# Patient Record
Sex: Male | Born: 1990 | Race: White | Hispanic: No | Marital: Single | Smoking: Current every day smoker
Health system: Southern US, Community
[De-identification: ages and names within clinical notes are randomized; demographics above are authoritative.]

## PROBLEM LIST (undated history)

## (undated) DIAGNOSIS — F329 Major depressive disorder, single episode, unspecified: Secondary | ICD-10-CM

## (undated) DIAGNOSIS — E669 Obesity, unspecified: Secondary | ICD-10-CM

## (undated) DIAGNOSIS — F32A Depression, unspecified: Secondary | ICD-10-CM

## (undated) DIAGNOSIS — G8929 Other chronic pain: Secondary | ICD-10-CM

## (undated) DIAGNOSIS — R55 Syncope and collapse: Secondary | ICD-10-CM

## (undated) DIAGNOSIS — Z87898 Personal history of other specified conditions: Secondary | ICD-10-CM

## (undated) DIAGNOSIS — M545 Low back pain: Secondary | ICD-10-CM

## (undated) DIAGNOSIS — F319 Bipolar disorder, unspecified: Secondary | ICD-10-CM

## (undated) DIAGNOSIS — E271 Primary adrenocortical insufficiency: Secondary | ICD-10-CM

## (undated) HISTORY — DX: Other chronic pain: G89.29

## (undated) HISTORY — DX: Low back pain: M54.5

## (undated) HISTORY — PX: ADENOIDECTOMY: SUR15

## (undated) HISTORY — PX: KNEE ARTHROSCOPY: SUR90

## (undated) HISTORY — PX: TONSILLECTOMY: SUR1361

## (undated) HISTORY — DX: Major depressive disorder, single episode, unspecified: F32.9

## (undated) HISTORY — PX: TOOTH EXTRACTION: SUR596

## (undated) HISTORY — DX: Syncope and collapse: R55

## (undated) HISTORY — DX: Depression, unspecified: F32.A

## (undated) HISTORY — PX: KNEE SURGERY: SHX244

## (undated) HISTORY — DX: Personal history of other specified conditions: Z87.898

---
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
## 2011-02-18 LAB — URINALYSIS, ROUTINE W REFLEX MICROSCOPIC
Glucose, UA: NEGATIVE
Ketones, ur: NEGATIVE
Nitrite: NEGATIVE
Specific Gravity, Urine: 1.028
pH: 6

## 2011-02-18 LAB — DRUGS OF ABUSE SCREEN W/O ALC, ROUTINE URINE
Amphetamine Screen, Ur: NEGATIVE
Barbiturate Quant, Ur: NEGATIVE
Creatinine,U: 234.7
Marijuana Metabolite: NEGATIVE
Phencyclidine (PCP): NEGATIVE
Propoxyphene: NEGATIVE

## 2011-02-18 LAB — HEPATIC FUNCTION PANEL
Albumin: 4.4
Indirect Bilirubin: 0.6
Total Protein: 6.8

## 2011-02-18 LAB — BASIC METABOLIC PANEL
CO2: 24
Calcium: 9.6
Creatinine, Ser: 0.79
Sodium: 138

## 2011-02-18 LAB — DIFFERENTIAL
Basophils Absolute: 0.1
Basophils Relative: 1
Lymphocytes Relative: 38
Monocytes Relative: 7
Neutro Abs: 4.9
Neutrophils Relative %: 54

## 2011-02-18 LAB — CBC
Hemoglobin: 15.1 — ABNORMAL HIGH
MCHC: 35.7
RBC: 5.09
RDW: 13

## 2011-02-18 LAB — GC/CHLAMYDIA PROBE AMP, URINE
Chlamydia, Swab/Urine, PCR: NEGATIVE
GC Probe Amp, Urine: NEGATIVE

## 2011-02-18 LAB — RPR: RPR Ser Ql: NONREACTIVE

## 2011-02-18 LAB — GAMMA GT: GGT: 22

## 2011-05-22 IMAGING — CT CT ANGIO CHEST
2 of 6 series · 18 of 36 positions shown · IV contrast (APPLIED)
Comparison: Plain film chest of 1 day prior.

CLINICAL DATA: Chest pain and shortness of breath.

CT ANGIOGRAPHY OF THE CHEST
TECHNIQUE: Multidetector CT angiography of the chest was performed
after contrast with bolus timed to evaluate the pulmonary arteries.
Multiplanar CT image reconstructions including MIPs were obtained
to evaluate the vascular anatomy.
Contrast:  80 cc Omnipaque 350

[Series 7: pe 1.0 b25f · axial · 0.69mm/px · z∈[+1326,+1548]mm · 17 of 249 slices shown]
[im 13/249  lung]
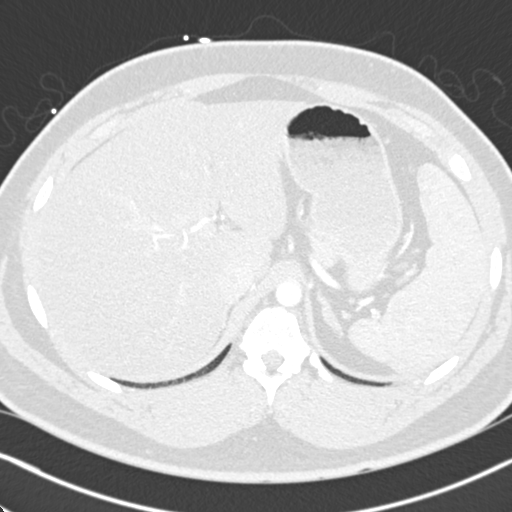
[im 25/249  mediastinal]
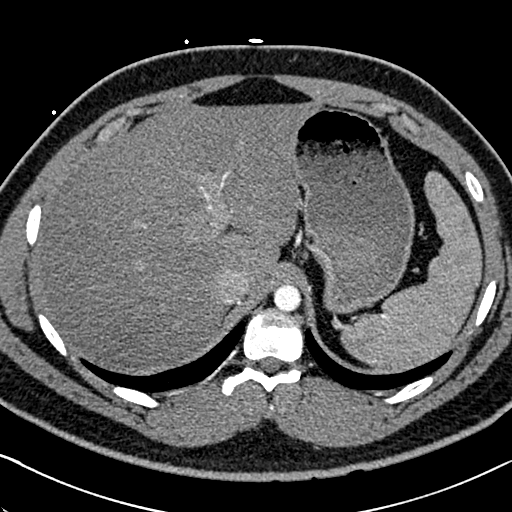
[im 38/249  lung]
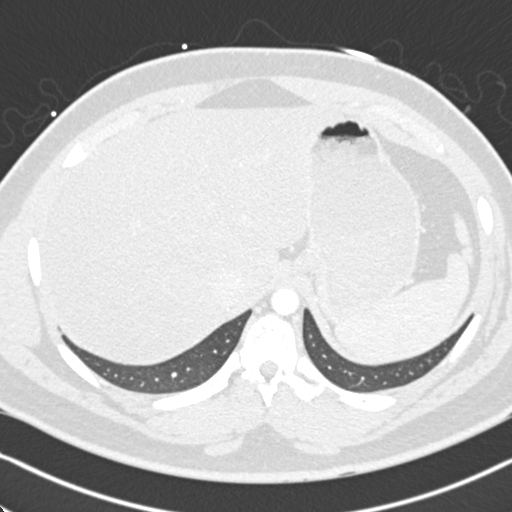
[im 50/249  mediastinal]
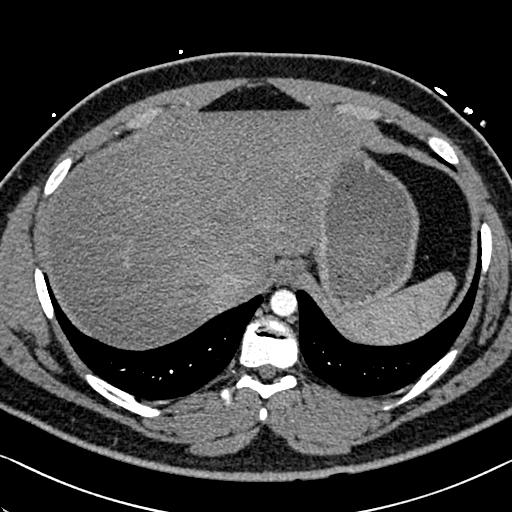
[im 75/249  lung]
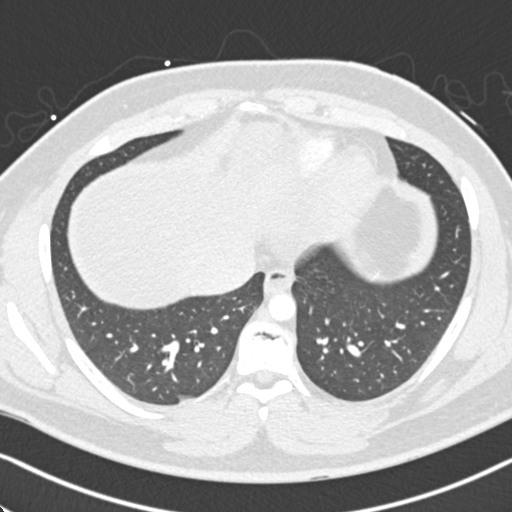
[im 87/249  mediastinal]
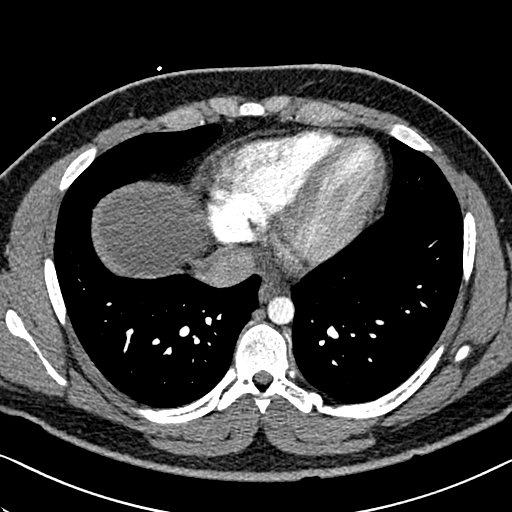
[im 100/249  lung]
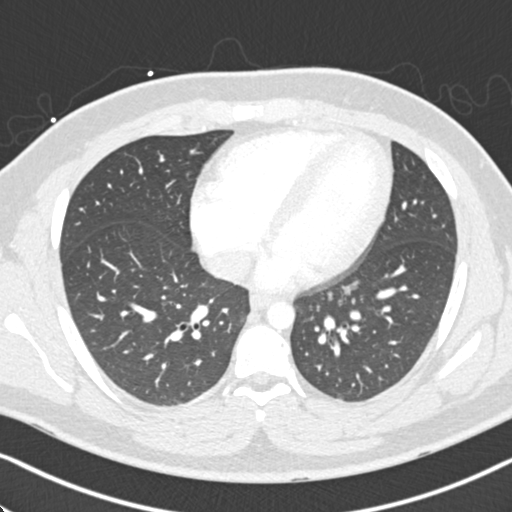
[im 112/249  mediastinal]
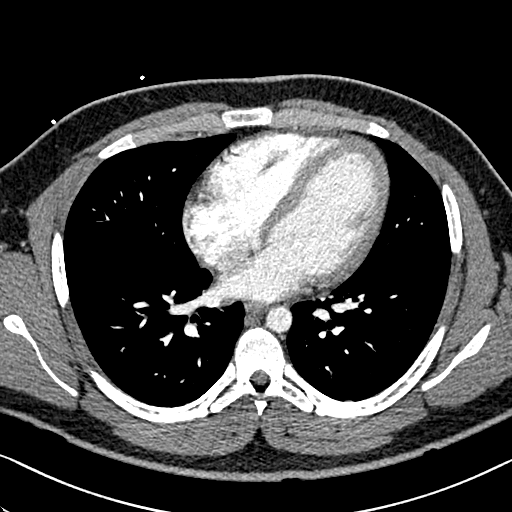
[im 125/249  lung]
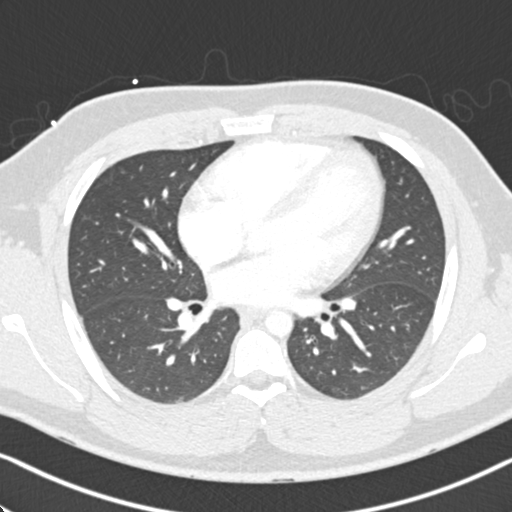
[im 137/249  mediastinal]
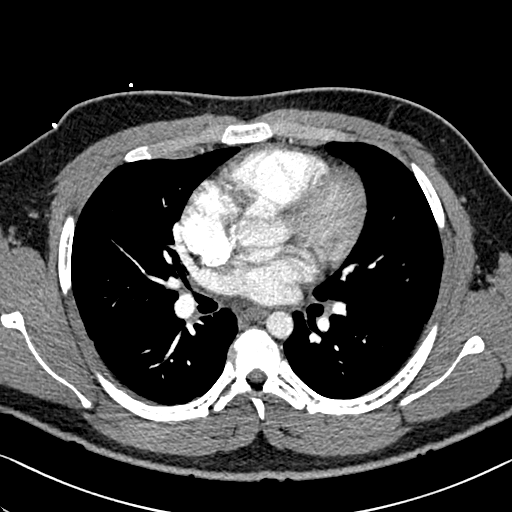
[im 149/249  lung]
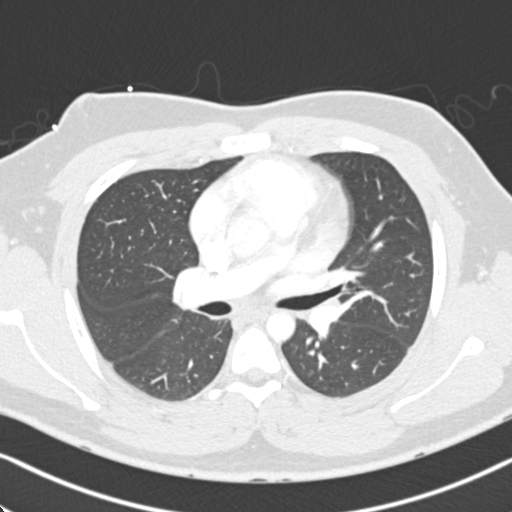
[im 162/249  mediastinal]
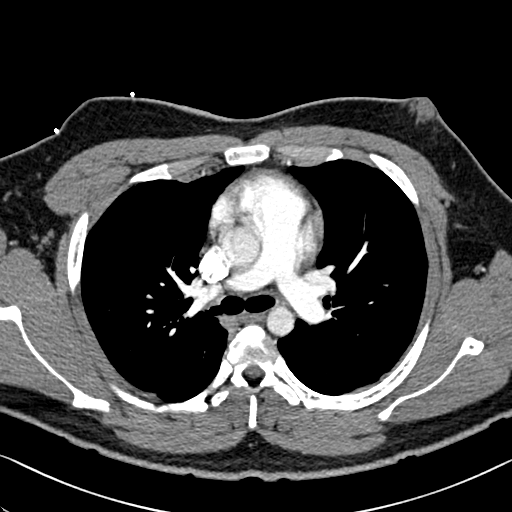
[im 174/249  lung]
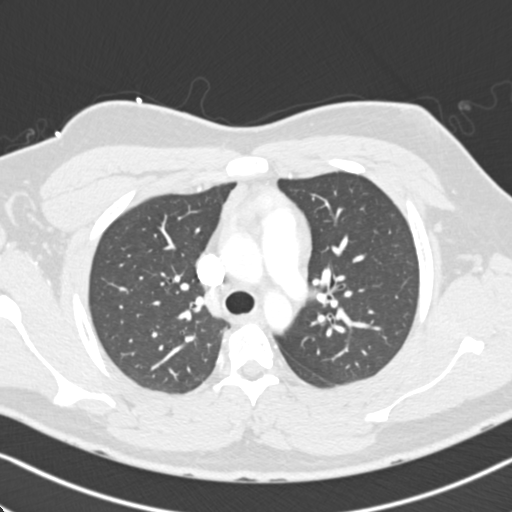
[im 199/249  mediastinal]
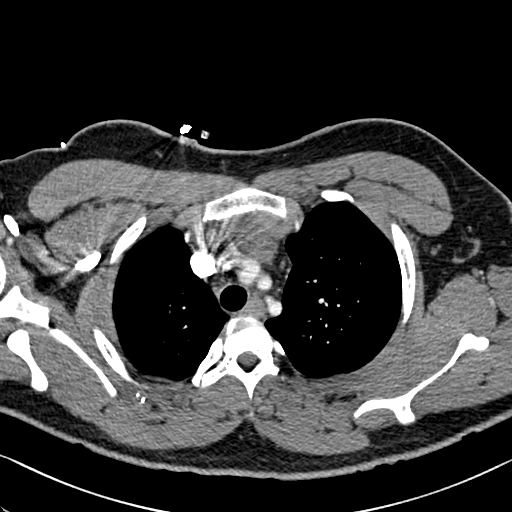
[im 211/249  lung]
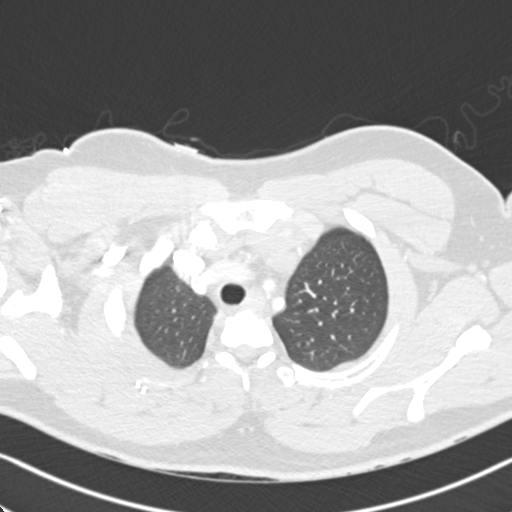
[im 224/249  mediastinal]
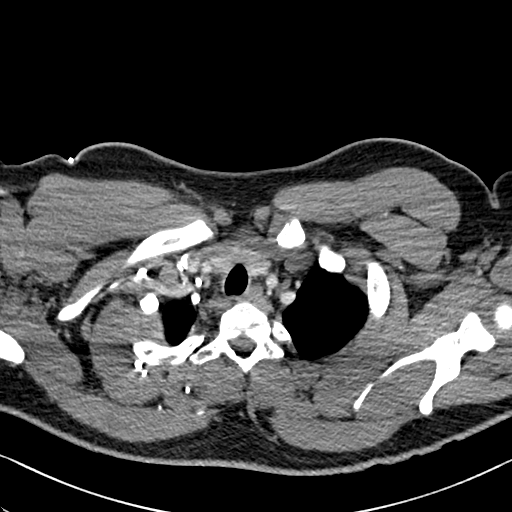
[im 236/249  lung]
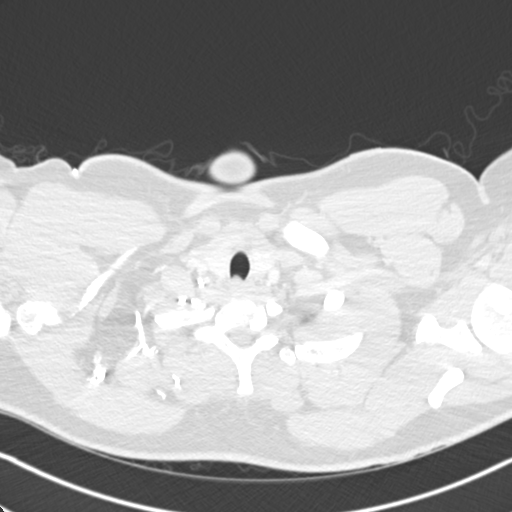

[Series 10: pe 2.0 coronal · coronal · 0.48mm/px · 1 of 133 slices shown]
[im 67/133  mediastinal]
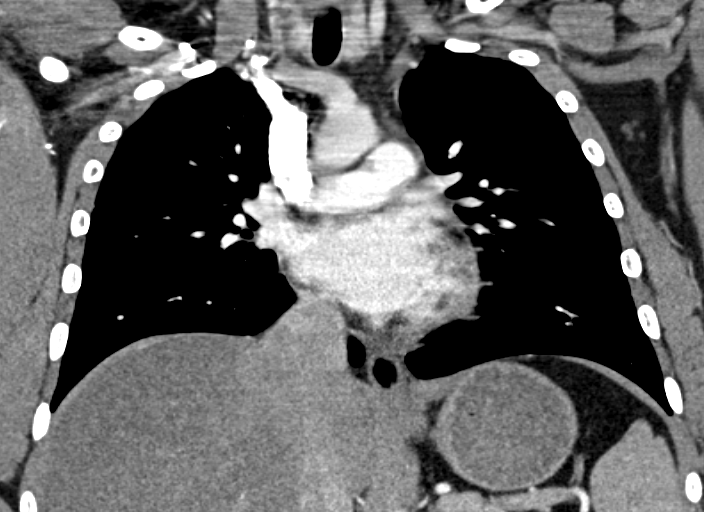

[18 of 36 positions shown; findings below may reference images not displayed]

FINDINGS: Lung windows demonstrate no nodules or airspace
opacities.

Soft tissue windows:  The quality of this exam for evaluation of
pulmonary embolism is good.  There is a large amount of contrast in
the SVC. No evidence of pulmonary embolism.

Left-sided gynecomastia. Normal aortic caliber without dissection.
Normal heart size without pericardial or pleural effusion.  No
mediastinal or hilar adenopathy.  There is soft tissue fullness
within the anterior mediastinum.  This measures maximally 3.6 x
cm.

Limited abdominal imaging demonstrates moderate to marked hepatic
steatosis. No acute osseous abnormality.  Numerous Schmorl's nodes
in the thoracic spine.

 Review of the MIP images confirms the above findings.
IMPRESSION: 1. No evidence of pulmonary embolism.
2.  Soft tissue fullness within the anterior mediastinum.  Although
this could represent residual thymus, it is somewhat greater than
typically identified.  Recommend follow-up with chest CT or (given
radiation concerns) thoracic MRI. These could be performed at 3
months and with contrast.
3.  Hepatic steatosis.
4.  Left-sided gynecomastia.

## 2011-08-29 ENCOUNTER — Emergency Department (INDEPENDENT_AMBULATORY_CARE_PROVIDER_SITE_OTHER): Payer: Federal, State, Local not specified - PPO

## 2011-08-29 ENCOUNTER — Emergency Department (HOSPITAL_BASED_OUTPATIENT_CLINIC_OR_DEPARTMENT_OTHER)
Admission: EM | Admit: 2011-08-29 | Discharge: 2011-08-29 | Disposition: A | Discharge status: Home / Self Care | Payer: Federal, State, Local not specified - PPO | Attending: Emergency Medicine | Admitting: Emergency Medicine

## 2011-08-29 ENCOUNTER — Encounter (HOSPITAL_BASED_OUTPATIENT_CLINIC_OR_DEPARTMENT_OTHER): Payer: Self-pay

## 2011-08-29 DIAGNOSIS — R0602 Shortness of breath: Secondary | ICD-10-CM

## 2011-08-29 DIAGNOSIS — F319 Bipolar disorder, unspecified: Secondary | ICD-10-CM | POA: Insufficient documentation

## 2011-08-29 DIAGNOSIS — J45909 Unspecified asthma, uncomplicated: Secondary | ICD-10-CM

## 2011-08-29 HISTORY — DX: Obesity, unspecified: E66.9

## 2011-08-29 HISTORY — DX: Bipolar disorder, unspecified: F31.9

## 2011-08-29 MED ORDER — PREDNISONE 50 MG PO TABS
60.0000 mg | ORAL_TABLET | Freq: Once | ORAL | Status: AC
  Administered 2011-08-29: 60 mg via ORAL
  Filled 2011-08-29: qty 1

## 2011-08-29 MED ORDER — ALBUTEROL SULFATE (5 MG/ML) 0.5% IN NEBU
5.0000 mg | INHALATION_SOLUTION | Freq: Once | RESPIRATORY_TRACT | Status: AC
  Administered 2011-08-29: 5 mg via RESPIRATORY_TRACT

## 2011-08-29 MED ORDER — ALBUTEROL SULFATE (5 MG/ML) 0.5% IN NEBU
5.0000 mg | INHALATION_SOLUTION | Freq: Once | RESPIRATORY_TRACT | Status: AC
  Administered 2011-08-29: 5 mg via RESPIRATORY_TRACT
  Filled 2011-08-29: qty 1

## 2011-08-29 MED ORDER — ALBUTEROL SULFATE HFA 108 (90 BASE) MCG/ACT IN AERS
2.0000 | INHALATION_SPRAY | RESPIRATORY_TRACT | Status: DC | PRN
  Administered 2011-08-29: 2 via RESPIRATORY_TRACT
  Filled 2011-08-29: qty 6.7

## 2011-08-29 MED ORDER — PREDNISONE 20 MG PO TABS: 40.0000 mg | ORAL_TABLET | Freq: Every day | ORAL | Status: DC

## 2011-08-29 MED ORDER — ALBUTEROL SULFATE (5 MG/ML) 0.5% IN NEBU
INHALATION_SOLUTION | RESPIRATORY_TRACT | Status: AC
  Administered 2011-08-29: 5 mg via RESPIRATORY_TRACT
  Filled 2011-08-29: qty 1

## 2011-08-29 MED ORDER — IPRATROPIUM BROMIDE 0.02 % IN SOLN
0.5000 mg | Freq: Once | RESPIRATORY_TRACT | Status: AC
  Administered 2011-08-29: 0.5 mg via RESPIRATORY_TRACT
  Filled 2011-08-29: qty 2.5

## 2011-08-29 NOTE — Discharge Instructions (Signed)
Asthma Attack Prevention HOW CAN ASTHMA BE PREVENTED? Currently, there is no way to prevent asthma from starting. However, you can take steps to control the disease and prevent its symptoms after you have been diagnosed. Learn about your asthma and how to control it. Take an active role to control your asthma by working with your caregiver to create and follow an asthma action plan. An asthma action plan guides you in taking your medicines properly, avoiding factors that make your asthma worse, tracking your level of asthma control, responding to worsening asthma, and seeking emergency care when needed. To track your asthma, keep records of your symptoms, check your peak flow number using a peak flow meter (handheld device that shows how well air moves out of your lungs), and get regular asthma checkups.  Other ways to prevent asthma attacks include:  Use medicines as your caregiver directs.   Identify and avoid things that make your asthma worse (as much as you can).   Keep track of your asthma symptoms and level of control.   Get regular checkups for your asthma.   With your caregiver, write a detailed plan for taking medicines and managing an asthma attack. Then be sure to follow your action plan. Asthma is an ongoing condition that needs regular monitoring and treatment.   Identify and avoid asthma triggers. A number of outdoor allergens and irritants (pollen, mold, cold air, air pollution) can trigger asthma attacks. Find out what causes or makes your asthma worse, and take steps to avoid those triggers (see below).   Monitor your breathing. Learn to recognize warning signs of an attack, such as slight coughing, wheezing or shortness of breath. However, your lung function may already decrease before you notice any signs or symptoms, so regularly measure and record your peak airflow with a home peak flow meter.   Identify and treat attacks early. If you act quickly, you're less likely to have  a severe attack. You will also need less medicine to control your symptoms. When your peak flow measurements decrease and alert you to an upcoming attack, take your medicine as instructed, and immediately stop any activity that may have triggered the attack. If your symptoms do not improve, get medical help.   Pay attention to increasing quick-relief inhaler use. If you find yourself relying on your quick-relief inhaler (such as albuterol), your asthma is not under control. See your caregiver about adjusting your treatment.  IDENTIFY AND CONTROL FACTORS THAT MAKE YOUR ASTHMA WORSE A number of common things can set off or make your asthma symptoms worse (asthma triggers). Keep track of your asthma symptoms for several weeks, detailing all the environmental and emotional factors that are linked with your asthma. When you have an asthma attack, go back to your asthma diary to see which factor, or combination of factors, might have contributed to it. Once you know what these factors are, you can take steps to control many of them.  Allergies: If you have allergies and asthma, it is important to take asthma prevention steps at home. Asthma attacks (worsening of asthma symptoms) can be triggered by allergies, which can cause temporary increased inflammation of your airways. Minimizing contact with the substance to which you are allergic will help prevent an asthma attack. Animal Dander:   Some people are allergic to the flakes of skin or dried saliva from animals with fur or feathers. Keep these pets out of your home.   If you can't keep a pet outdoors, keep the   pet out of your bedroom and other sleeping areas at all times, and keep the door closed.   Remove carpets and furniture covered with cloth from your home. If that is not possible, keep the pet away from fabric-covered furniture and carpets.  Dust Mites:  Many people with asthma are allergic to dust mites. Dust mites are tiny bugs that are found in  every home, in mattresses, pillows, carpets, fabric-covered furniture, bedcovers, clothes, stuffed toys, fabric, and other fabric-covered items.   Cover your mattress in a special dust-proof cover.   Cover your pillow in a special dust-proof cover, or wash the pillow each week in hot water. Water must be hotter than 130 F to kill dust mites. Cold or warm water used with detergent and bleach can also be effective.   Wash the sheets and blankets on your bed each week in hot water.   Try not to sleep or lie on cloth-covered cushions.   Call ahead when traveling and ask for a smoke-free hotel room. Bring your own bedding and pillows, in case the hotel only supplies feather pillows and down comforters, which may contain dust mites and cause asthma symptoms.   Remove carpets from your bedroom and those laid on concrete, if you can.   Keep stuffed toys out of the bed, or wash the toys weekly in hot water or cooler water with detergent and bleach.  Cockroaches:  Many people with asthma are allergic to the droppings and remains of cockroaches.   Keep food and garbage in closed containers. Never leave food out.   Use poison baits, traps, powders, gels, or paste (for example, boric acid).   If a spray is used to kill cockroaches, stay out of the room until the odor goes away.  Indoor Mold:  Fix leaky faucets, pipes, or other sources of water that have mold around them.   Clean moldy surfaces with a cleaner that has bleach in it.  Pollen and Outdoor Mold:  When pollen or mold spore counts are high, try to keep your windows closed.   Stay indoors with windows closed from late morning to afternoon, if you can. Pollen and some mold spore counts are highest at that time.   Ask your caregiver whether you need to take or increase anti-inflammatory medicine before your allergy season starts.  Irritants:   Tobacco smoke is an irritant. If you smoke, ask your caregiver how you can quit. Ask family  members to quit smoking, too. Do not allow smoking in your home or car.   If possible, do not use a wood-burning stove, kerosene heater, or fireplace. Minimize exposure to all sources of smoke, including incense, candles, fires, and fireworks.   Try to stay away from strong odors and sprays, such as perfume, talcum powder, hair spray, and paints.   Decrease humidity in your home and use an indoor air cleaning device. Reduce indoor humidity to below 60 percent. Dehumidifiers or central air conditioners can do this.   Try to have someone else vacuum for you once or twice a week, if you can. Stay out of rooms while they are being vacuumed and for a short while afterward.   If you vacuum, use a dust mask from a hardware store, a double-layered or microfilter vacuum cleaner bag, or a vacuum cleaner with a HEPA filter.   Sulfites in foods and beverages can be irritants. Do not drink beer or wine, or eat dried fruit, processed potatoes, or shrimp if they cause asthma   symptoms.   Cold air can trigger an asthma attack. Cover your nose and mouth with a scarf on cold or windy days.   Several health conditions can make asthma more difficult to manage, including runny nose, sinus infections, reflux disease, psychological stress, and sleep apnea. Your caregiver will treat these conditions, as well.   Avoid close contact with people who have a cold or the flu, since your asthma symptoms may get worse if you catch the infection from them. Wash your hands thoroughly after touching items that may have been handled by people with a respiratory infection.   Get a flu shot every year to protect against the flu virus, which often makes asthma worse for days or weeks. Also get a pneumonia shot once every five to 10 years.  Drugs:  Aspirin and other painkillers can cause asthma attacks. 10% to 20% of people with asthma have sensitivity to aspirin or a group of painkillers called non-steroidal anti-inflammatory drugs  (NSAIDS), such as ibuprofen and naproxen. These drugs are used to treat pain and reduce fevers. Asthma attacks caused by any of these medicines can be severe and even fatal. These drugs must be avoided in people who have known aspirin sensitive asthma. Products with acetaminophen are considered safe for people who have asthma. It is important that people with aspirin sensitivity read labels of all over-the-counter drugs used to treat pain, colds, coughs, and fever.   Beta blockers and ACE inhibitors are other drugs which you should discuss with your caregiver, in relation to your asthma.  ALLERGY SKIN TESTING  Ask your asthma caregiver about allergy skin testing or blood testing (RAST test) to identify the allergens to which you are sensitive. If you are found to have allergies, allergy shots (immunotherapy) for asthma may help prevent future allergies and asthma. With allergy shots, small doses of allergens (substances to which you are allergic) are injected under your skin on a regular schedule. Over a period of time, your body may become used to the allergen and less responsive with asthma symptoms. You can also take measures to minimize your exposure to those allergens. EXERCISE  If you have exercise-induced asthma, or are planning vigorous exercise, or exercise in cold, humid, or dry environments, prevent exercise-induced asthma by following your caregiver's advice regarding asthma treatment before exercising. Document Released: 04/16/2009 Document Revised: 04/17/2011 Document Reviewed: 04/16/2009 [employer] Patient Information 2012 [employer]. 

## 2011-08-29 NOTE — ED Provider Notes (Signed)
History     CSN: [id number]  Arrival date & time 08/29/11  2000   First MD Initiated Contact with Patient 08/29/11 2034      Chief Complaint  Patient presents with  . Shortness of Breath    (Consider location/radiation/quality/duration/timing/severity/associated sxs/prior treatment) HPI Comments: Pt states that he has been using his neb without relief:pt states that he seems to have a flare up once a year:pt denies fever  Patient is a 21 y.o. male presenting with shortness of breath. The history is provided by the patient. No language interpreter was used.  Shortness of Breath  The current episode started today. The problem occurs continuously. The problem has been unchanged. The problem is mild. The symptoms are relieved by nothing. The symptoms are aggravated by nothing. Associated symptoms include cough and shortness of breath. Pertinent negatives include no fever.    Past Medical History  Diagnosis Date  . Asthma   . Obesity   . Bipolar disorder     Past Surgical History  Procedure Date  . Tonsillectomy   . Adenoidectomy   . Knee surgery   . Tooth extraction     History reviewed. No pertinent family history.  History  Substance Use Topics  . Smoking status: Never Smoker   . Smokeless tobacco: Never Used  . Alcohol Use: No      Review of Systems  Constitutional: Negative for fever.  Eyes: Negative.   Respiratory: Positive for cough and shortness of breath.   Musculoskeletal: Negative.   Neurological: Negative.     Allergies  Geodon and Sulfa antibiotics  Home Medications   Current Outpatient Rx  Name Route Sig Dispense Refill  . ARIPIPRAZOLE 10 MG PO TABS Oral Take 10 mg by mouth daily.    . GUAIFENESIN-DM 100-10 MG/5ML PO SYRP Oral Take 10 mLs by mouth 3 (three) times daily as needed. Patient used this medication for cold symptoms.    Marland Kitchen CLARITIN PO Oral Take 1 tablet by mouth daily as needed. Patient uses this medication for allergies.    .  SERTRALINE HCL 100 MG PO TABS Oral Take 100 mg by mouth daily.      BP 131/66  Pulse 71  Temp(Src) 98.5 F (36.9 C) (Oral)  Resp 26  Ht 5\' 11"  (1.803 m)  Wt 250 lb (113.399 kg)  BMI 34.87 kg/m2  SpO2 97%  Physical Exam  Nursing note and vitals reviewed. Constitutional: He is oriented to person, place, and time. He appears well-developed and well-nourished.  HENT:  Right Ear: External ear normal.  Left Ear: External ear normal.  Nose: Rhinorrhea present.  Mouth/Throat: Posterior oropharyngeal erythema present.  Eyes: EOM are normal.  Neck: Neck supple.  Cardiovascular: Normal rate and regular rhythm.   Pulmonary/Chest: He has wheezes.  Musculoskeletal: Normal range of motion.  Neurological: He is alert and oriented to person, place, and time.  Skin: Skin is warm and dry.  Psychiatric: He has a normal mood and affect.    ED Course  Procedures (including critical care time)  Labs Reviewed - No data to display Dg Chest 2 View  08/29/2011  *RADIOLOGY REPORT*  Clinical Data: Short of breath for 2 days.  History of asthma.  CHEST - 2 VIEW  Comparison: 07/30/2010 and 07/31/2010 CT.  Findings: Midline trachea.  Normal heart size and mediastinal contours. No pleural effusion or pneumothorax.  Mildly low lung volumes. Clear lungs.  IMPRESSION: Low lung volumes without acute disease.  Original Report Authenticated By: Karn Cassis.  TALBOT, M.D.     1. Asthma       MDM  Pt is not longer wheezing and is feeling better at this time:will put on steroids for a couple of day        Teressa Lower, NP 08/29/11 2208

## 2011-08-29 NOTE — Patient Instructions (Signed)
Pt instructed on the use of administering albuteral mdi via aerochamber. Pt toelrated well.

## 2011-08-29 NOTE — ED Notes (Signed)
Pt states that he lives with a smoker, c/o head congestion, wheezing, shortness of breath, cough (prod for clear sputum).  Denies fever, n/v/d/c, pain.

## 2011-08-30 NOTE — ED Provider Notes (Signed)
Medical screening examination/treatment/procedure(s) were performed by non-physician practitioner and as supervising physician I was immediately available for consultation/collaboration.   Carleene Cooper III, MD 08/30/11 1150

## 2012-10-16 DIAGNOSIS — J45909 Unspecified asthma, uncomplicated: Secondary | ICD-10-CM | POA: Insufficient documentation

## 2012-10-16 DIAGNOSIS — F309 Manic episode, unspecified: Secondary | ICD-10-CM | POA: Insufficient documentation

## 2012-12-14 DIAGNOSIS — T6391XA Toxic effect of contact with unspecified venomous animal, accidental (unintentional), initial encounter: Secondary | ICD-10-CM | POA: Insufficient documentation

## 2013-03-08 ENCOUNTER — Encounter (HOSPITAL_BASED_OUTPATIENT_CLINIC_OR_DEPARTMENT_OTHER): Payer: Self-pay | Admitting: Emergency Medicine

## 2013-03-08 ENCOUNTER — Emergency Department (HOSPITAL_BASED_OUTPATIENT_CLINIC_OR_DEPARTMENT_OTHER)
Admission: EM | Admit: 2013-03-08 | Discharge: 2013-03-08 | Disposition: A | Discharge status: Home / Self Care | Payer: Federal, State, Local not specified - PPO | Attending: Emergency Medicine | Admitting: Emergency Medicine

## 2013-03-08 DIAGNOSIS — Z79899 Other long term (current) drug therapy: Secondary | ICD-10-CM | POA: Insufficient documentation

## 2013-03-08 DIAGNOSIS — J45909 Unspecified asthma, uncomplicated: Secondary | ICD-10-CM | POA: Insufficient documentation

## 2013-03-08 DIAGNOSIS — F319 Bipolar disorder, unspecified: Secondary | ICD-10-CM | POA: Insufficient documentation

## 2013-03-08 DIAGNOSIS — E2749 Other adrenocortical insufficiency: Secondary | ICD-10-CM | POA: Insufficient documentation

## 2013-03-08 DIAGNOSIS — M545 Low back pain, unspecified: Secondary | ICD-10-CM | POA: Insufficient documentation

## 2013-03-08 DIAGNOSIS — M542 Cervicalgia: Secondary | ICD-10-CM | POA: Insufficient documentation

## 2013-03-08 DIAGNOSIS — M549 Dorsalgia, unspecified: Secondary | ICD-10-CM

## 2013-03-08 DIAGNOSIS — IMO0002 Reserved for concepts with insufficient information to code with codable children: Secondary | ICD-10-CM | POA: Insufficient documentation

## 2013-03-08 DIAGNOSIS — E669 Obesity, unspecified: Secondary | ICD-10-CM | POA: Insufficient documentation

## 2013-03-08 HISTORY — DX: Primary adrenocortical insufficiency: E27.1

## 2013-03-08 MED ORDER — DIAZEPAM 5 MG PO TABS
5.0000 mg | ORAL_TABLET | Freq: Once | ORAL | Status: AC
  Administered 2013-03-08: 5 mg via ORAL
  Filled 2013-03-08: qty 1

## 2013-03-08 MED ORDER — IBUPROFEN 800 MG PO TABS
ORAL_TABLET | ORAL | Status: AC
  Filled 2013-03-08: qty 1

## 2013-03-08 MED ORDER — IBUPROFEN 800 MG PO TABS: 800.0000 mg | ORAL_TABLET | Freq: Three times a day (TID) | ORAL | Status: DC

## 2013-03-08 MED ORDER — OXYCODONE-ACETAMINOPHEN 5-325 MG PO TABS: 1.0000 | ORAL_TABLET | Freq: Four times a day (QID) | ORAL | Status: DC | PRN

## 2013-03-08 MED ORDER — IBUPROFEN 800 MG PO TABS
800.0000 mg | ORAL_TABLET | Freq: Once | ORAL | Status: AC
  Administered 2013-03-08: 800 mg via ORAL
  Filled 2013-03-08: qty 1

## 2013-03-08 MED ORDER — OXYCODONE-ACETAMINOPHEN 5-325 MG PO TABS
1.0000 | ORAL_TABLET | Freq: Once | ORAL | Status: AC
  Administered 2013-03-08: 1 via ORAL
  Filled 2013-03-08: qty 1

## 2013-03-08 MED ORDER — CYCLOBENZAPRINE HCL 5 MG PO TABS: 5.0000 mg | ORAL_TABLET | Freq: Three times a day (TID) | ORAL | Status: DC | PRN

## 2013-03-08 NOTE — ED Notes (Signed)
Pt. Reports neck and back pain with neck pain starting today and back pain chronic.

## 2013-03-08 NOTE — ED Notes (Signed)
MD at bedside. 

## 2013-03-08 NOTE — ED Provider Notes (Signed)
CSN: [id number]     Arrival date & time 03/08/13  2003 History   First MD Initiated Contact with Patient 03/08/13 2256     Chief Complaint  Patient presents with  . Back Pain  . Neck Pain   (Consider location/radiation/quality/duration/timing/severity/associated sxs/prior Treatment) HPI Pt presents with c/o low back pain which has been present since august as well as pain in left side of neck which has been going on for the past 2-3 days.  Pain is worse with movement and palpation.  No fever, no injuries or trauma.  No new activities.  Pt has seen chiropractor for symptoms in the past and has had physical therapy but states the pain recurred after stopping the physical therapy.  Pain in low back is bilateral.  No fever/chills.  No weakness in legs, no incontinence of urine, no retention of urine.  No incontinence of stool.  There are no other associated systemic symptoms, there are no other alleviating or modifying factors.   Past Medical History  Diagnosis Date  . Asthma   . Obesity   . Bipolar disorder   . Addison's disease    Past Surgical History  Procedure Laterality Date  . Tonsillectomy    . Adenoidectomy    . Knee surgery    . Tooth extraction     No family history on file. History  Substance Use Topics  . Smoking status: Never Smoker   . Smokeless tobacco: Never Used  . Alcohol Use: No    Review of Systems ROS reviewed and all otherwise negative except for mentioned in HPI  Allergies  Geodon and Sulfa antibiotics  Home Medications   Current Outpatient Rx  Name  Route  Sig  Dispense  Refill  . fludrocortisone (FLORINEF) 0.1 MG tablet   Oral   Take 0.1 mg by mouth daily.         . ARIPiprazole (ABILIFY) 10 MG tablet   Oral   Take 10 mg by mouth daily.         . cyclobenzaprine (FLEXERIL) 5 MG tablet   Oral   Take 1 tablet (5 mg total) by mouth 3 (three) times daily as needed for muscle spasms.   20 tablet   0   . guaiFENesin-dextromethorphan  (ROBITUSSIN DM) 100-10 MG/5ML syrup   Oral   Take 10 mLs by mouth 3 (three) times daily as needed. Patient used this medication for cold symptoms.         Marland Kitchen ibuprofen (ADVIL,MOTRIN) 800 MG tablet   Oral   Take 1 tablet (800 mg total) by mouth 3 (three) times daily.   21 tablet   0   . Loratadine (CLARITIN PO)   Oral   Take 1 tablet by mouth daily as needed. Patient uses this medication for allergies.         Marland Kitchen oxyCODONE-acetaminophen (PERCOCET/ROXICET) 5-325 MG per tablet   Oral   Take 1-2 tablets by mouth every 6 (six) hours as needed for pain.   15 tablet   0   . predniSONE (DELTASONE) 20 MG tablet   Oral   Take 2 tablets (40 mg total) by mouth daily.   10 tablet   0   . sertraline (ZOLOFT) 100 MG tablet   Oral   Take 100 mg by mouth daily.          BP 120/72  Pulse 76  Temp(Src) 98.4 F (36.9 C) (Oral)  Resp 17  Ht 5\' 10"  (1.778 m)  Wt  240 lb (108.863 kg)  BMI 34.44 kg/m2  SpO2 99% Vitals reviewed Physical Exam Physical Examination: General appearance - alert, well appearing, and in no distress Mental status - alert, oriented to person, place, and time Eyes - no scleral icterus, no conjunctival injection Neck - supple, no significant adenopathy, no midlinte tenderness to palpation, some ttp on left cervical parapsinal region Chest - clear to auscultation, no wheezes, rales or rhonchi, symmetric air entry Heart - normal rate, regular rhythm, normal S1, S2, no murmurs, rubs, clicks or gallops Back exam - no midline tenderness to palpation, mild bialteral lumbar paraspinal tenderness, no CVA tenderness Neurological - alert, oriented, normal speech, strength 5/5 in extremities x 4, sensation intact Musculoskeletal - no joint tenderness, deformity or swelling Extremities - peripheral pulses normal, no pedal edema, no clubbing or cyanosis Skin - normal coloration and turgor, no rashes  ED Course  Procedures (including critical care time) Labs Review Labs  Reviewed - No data to display Imaging Review No results found.  EKG Interpretation   None       MDM   1. Back pain   2. Neck pain on left side    Pt presenting with c/o low back pain and left sided neck pain.  Exam c/w muscular pain, no signs or symptoms of cauda equina.  No fever or risk factors for epidural abscess.  Pt started on ibuprofen, muscle relaxers, and pain medications.   Advised PMD f/u and advised that if pain continues he may need MRI on an outpatient basis.  Discharged with strict return precautions.  Pt agreeable with plan.    Ethelda Chick, MD 03/09/13 [phone]

## 2013-04-04 DIAGNOSIS — M5430 Sciatica, unspecified side: Secondary | ICD-10-CM | POA: Insufficient documentation

## 2013-10-08 ENCOUNTER — Emergency Department (HOSPITAL_BASED_OUTPATIENT_CLINIC_OR_DEPARTMENT_OTHER)
Admission: EM | Admit: 2013-10-08 | Discharge: 2013-10-08 | Disposition: A | Discharge status: Home / Self Care | Payer: Federal, State, Local not specified - PPO | Attending: Emergency Medicine | Admitting: Emergency Medicine

## 2013-10-08 ENCOUNTER — Encounter (HOSPITAL_BASED_OUTPATIENT_CLINIC_OR_DEPARTMENT_OTHER): Payer: Self-pay | Admitting: Emergency Medicine

## 2013-10-08 DIAGNOSIS — E2749 Other adrenocortical insufficiency: Secondary | ICD-10-CM | POA: Insufficient documentation

## 2013-10-08 DIAGNOSIS — E669 Obesity, unspecified: Secondary | ICD-10-CM | POA: Insufficient documentation

## 2013-10-08 DIAGNOSIS — IMO0002 Reserved for concepts with insufficient information to code with codable children: Secondary | ICD-10-CM | POA: Insufficient documentation

## 2013-10-08 DIAGNOSIS — J45909 Unspecified asthma, uncomplicated: Secondary | ICD-10-CM | POA: Insufficient documentation

## 2013-10-08 DIAGNOSIS — Z79899 Other long term (current) drug therapy: Secondary | ICD-10-CM | POA: Insufficient documentation

## 2013-10-08 DIAGNOSIS — R197 Diarrhea, unspecified: Secondary | ICD-10-CM | POA: Insufficient documentation

## 2013-10-08 DIAGNOSIS — F319 Bipolar disorder, unspecified: Secondary | ICD-10-CM | POA: Insufficient documentation

## 2013-10-08 DIAGNOSIS — R55 Syncope and collapse: Secondary | ICD-10-CM

## 2013-10-08 DIAGNOSIS — E271 Primary adrenocortical insufficiency: Secondary | ICD-10-CM

## 2013-10-08 LAB — CBC WITH DIFFERENTIAL/PLATELET
BASOS ABS: 0 10*3/uL (ref 0.0–0.1)
BASOS PCT: 1 % (ref 0–1)
EOS ABS: 0.3 10*3/uL (ref 0.0–0.7)
EOS PCT: 5 % (ref 0–5)
HCT: 43.1 % (ref 39.0–52.0)
Hemoglobin: 15.9 g/dL (ref 13.0–17.0)
Lymphocytes Relative: 28 % (ref 12–46)
Lymphs Abs: 1.6 10*3/uL (ref 0.7–4.0)
MCH: 30.7 pg (ref 26.0–34.0)
MCHC: 36.9 g/dL — AB (ref 30.0–36.0)
MCV: 83.2 fL (ref 78.0–100.0)
Monocytes Absolute: 0.7 10*3/uL (ref 0.1–1.0)
Monocytes Relative: 11 % (ref 3–12)
Neutro Abs: 3.3 10*3/uL (ref 1.7–7.7)
Neutrophils Relative %: 56 % (ref 43–77)
PLATELETS: 166 10*3/uL (ref 150–400)
RBC: 5.18 MIL/uL (ref 4.22–5.81)
RDW: 12.1 % (ref 11.5–15.5)
WBC: 5.8 10*3/uL (ref 4.0–10.5)

## 2013-10-08 LAB — URINALYSIS, ROUTINE W REFLEX MICROSCOPIC
BILIRUBIN URINE: NEGATIVE
Glucose, UA: NEGATIVE mg/dL
HGB URINE DIPSTICK: NEGATIVE
Ketones, ur: NEGATIVE mg/dL
Leukocytes, UA: NEGATIVE
NITRITE: NEGATIVE
PROTEIN: NEGATIVE mg/dL
Specific Gravity, Urine: 1.025 (ref 1.005–1.030)
UROBILINOGEN UA: 0.2 mg/dL (ref 0.0–1.0)
pH: 6 (ref 5.0–8.0)

## 2013-10-08 LAB — COMPREHENSIVE METABOLIC PANEL
ALBUMIN: 4.5 g/dL (ref 3.5–5.2)
ALT: 26 U/L (ref 0–53)
AST: 23 U/L (ref 0–37)
Alkaline Phosphatase: 100 U/L (ref 39–117)
BUN: 15 mg/dL (ref 6–23)
CALCIUM: 9.8 mg/dL (ref 8.4–10.5)
CO2: 24 mEq/L (ref 19–32)
Chloride: 103 mEq/L (ref 96–112)
Creatinine, Ser: 1 mg/dL (ref 0.50–1.35)
GFR calc non Af Amer: >90 mL/min (ref >90)
GLUCOSE: 95 mg/dL (ref 70–99)
Potassium: 4.4 mEq/L (ref 3.7–5.3)
SODIUM: 140 meq/L (ref 137–147)
TOTAL PROTEIN: 7.4 g/dL (ref 6.0–8.3)
Total Bilirubin: 0.6 mg/dL (ref 0.3–1.2)

## 2013-10-08 MED ORDER — SODIUM CHLORIDE 0.9 % IV SOLN
Freq: Once | INTRAVENOUS | Status: AC
  Administered 2013-10-08: 1000 mL via INTRAVENOUS

## 2013-10-08 NOTE — ED Notes (Signed)
Patient c/o diarrhea since Tuesday, no vomiting, no fever , generalized abd pain, took imodium yesterday, some diarrhea at [facility]

## 2013-10-08 NOTE — ED Provider Notes (Signed)
CSN: [id number]     Arrival date & time 10/08/13  1148 History   First MD Initiated Contact with Patient 10/08/13 1209     Chief Complaint  Patient presents with  . Loss of Consciousness     (Consider location/radiation/quality/duration/timing/severity/associated sxs/prior Treatment) Patient is a 23 y.o. male presenting with syncope. The history is provided by the patient.  Loss of Consciousness Episode history:  Single Most recent episode:  Today Timing:  Constant Progression:  Resolved Chronicity:  New Witnessed: no   Relieved by:  Nothing Worsened by:  Nothing tried Ineffective treatments:  None tried Associated symptoms: no nausea and no palpitations   Risk factors: no congenital heart disease   Pt has a history of addison's disease.   Pt reports He has has diarrhea for several days prioer to working outside today  Past Medical History  Diagnosis Date  . Asthma   . Obesity   . Bipolar disorder   . Addison's disease    Past Surgical History  Procedure Laterality Date  . Tonsillectomy    . Adenoidectomy    . Knee surgery    . Tooth extraction     History reviewed. No pertinent family history. History  Substance Use Topics  . Smoking status: Never Smoker   . Smokeless tobacco: Never Used  . Alcohol Use: No    Review of Systems  Cardiovascular: Positive for syncope. Negative for palpitations.  Gastrointestinal: Negative for nausea.  All other systems reviewed and are negative.     Allergies  Geodon and Sulfa antibiotics  Home Medications   Prior to Admission medications   Medication Sig Start Date End Date Taking? Authorizing Provider  ARIPiprazole (ABILIFY) 10 MG tablet Take 10 mg by mouth daily.    Historical Provider, MD  fludrocortisone (FLORINEF) 0.1 MG tablet Take 0.1 mg by mouth daily.    Historical Provider, MD  guaiFENesin-dextromethorphan (ROBITUSSIN DM) 100-10 MG/5ML syrup Take 10 mLs by mouth 3 (three) times daily as needed. Patient used  this medication for cold symptoms.    Historical Provider, MD  Loratadine (CLARITIN PO) Take 1 tablet by mouth daily as needed. Patient uses this medication for allergies.    Historical Provider, MD  sertraline (ZOLOFT) 100 MG tablet Take 100 mg by mouth daily.    Historical Provider, MD   BP 121/73  Pulse 94  Temp(Src) 98.5 F (36.9 C) (Oral)  Resp 20  SpO2 99% Physical Exam  Nursing note and vitals reviewed. Constitutional: He is oriented to person, place, and time. He appears well-developed and well-nourished.  HENT:  Head: Normocephalic.  Right Ear: External ear normal.  Left Ear: External ear normal.  Eyes: Conjunctivae and EOM are normal. Pupils are equal, round, and reactive to light.  Neck: Normal range of motion.  Cardiovascular: Normal rate, regular rhythm and normal heart sounds.   Pulmonary/Chest: Effort normal and breath sounds normal.  Abdominal: Soft. He exhibits no distension.  Musculoskeletal: Normal range of motion.  Neurological: He is alert and oriented to person, place, and time.  Skin: Skin is warm.  Psychiatric: He has a normal mood and affect.    ED Course  Procedures (including critical care time) Labs Review Labs Reviewed - No data to display  Imaging Review No results found.   EKG Interpretation   Date/Time:  Saturday Oct 08 2013 11:55:48 EDT Ventricular Rate:  86 PR Interval:  167 QRS Duration: 96 QT Interval:  350 QTC Calculation: 419 R Axis:   79 Text Interpretation:  Sinus rhythm ST elev, probable normal early repol  pattern Baseline wander in lead(s) V3 No significant change since last  tracing Confirmed by [facility]  MD, Alphonzo LemmingsWHITNEY ([id number]) on 10/08/2013 12:04:27  PM      MDM   Final diagnoses:  None        Elson AreasLeslie K Jeana Kersting, PA-C 10/08/13 1403

## 2013-10-08 NOTE — Discharge Instructions (Signed)
Addison's Disease Addison's disease is a glandular (endocrine) or hormonal disorder. It is also called adrenal insufficiency or hypocortisolism. It affects about 1 in 100,000 people. It can affect men and women in all age groups. This disease occurs when the adrenal glands do not make enough of the hormone cortisol. In some cases, the adrenal glands also do not make the hormone aldosterone. Without the right levels of these hormones, your body cannot maintain critical life functions. The adrenal glands are located just above the kidneys. Cortisol is a steroid hormone. Its most important job is to help the body respond to stress. Cortisol also helps the body to:  Maintain blood pressure and heart(cardiovascular) function.  Slow the immune system's response to inflammation.  Control the use of proteins, carbohydrates (sugars), and fats.  Maintain a sense of well-being. CAUSES  A lack of cortisol can happen for different reasons.   Primary adrenal insufficiency is Addison's disease. The adrenal glands do not produce enough, or any, cortisol. Usually, aldosterone is also not produced.  Secondary adrenal insufficiency. The pituitary gland may not make enough of a hormone called ACTH (adrenocorticotropin). This hormone causes the adrenal glands to produce cortisol. Usually, there is enough aldosterone. Primary Adrenal Insufficiency can be caused by:  Autoimmune disease. Your body can produce antibodies that attack its own organs (in this case, your adrenal glands). The reasons why this happens are not well understood at this time. Sometimes, other organs are also affected (the polyglandular autoimmune syndromes).  An infection of the adrenal glands. Possible causes include tuberculosis, viruses (including HIV), and fungal infections. Secondary Adrenal Insufficiency This form is much more common than the primary form. It can be traced to a lack of ACTH. Without ACTH, the adrenal glands cannot make  cortisol. Causes include:  Diseases of the pituitary gland. This is a small gland in the brain that controls many important body functions. The pituitary gland produces ACTH, among other hormones. Pituitary gland tumors, injury, or surgery can cause inadequate ACTH production, which causes inadequate cortisol production.  Medications:  Megestrol (used for cancer treatment and to stimulate appetite) and some pain medications can impair production of cortisol.  Use of cortisol medication (steroids) causes your adrenal glands to not produce cortisol. When you stop this medication, it can take time for the adrenal glands to start producing cortisol again. This can be a dangerous situation, requiring slowly reducing your cortisol medicine. SYMPTOMS  Symptoms of adrenal insufficiency normally begin slowly. Problems seen with the disease are:  Severe tiredness (fatigue).  Muscle weakness.  Loss of appetite.  Weight loss. About 50% of the time, the following symptoms occur:   Nausea, vomiting and diarrhea.  Drops in blood pressure.  Dizziness or fainting.  Darkening of the skin (with primary disease only).  Being easily angered (irritable).  Depression.  Salt craving.  Low blood sugar (hypoglycemia).  Irregular or no menstrual periods. The symptoms slowly get worse. They are often ignored until a stressful event like an illness or an accident occurs. This is called an Addisonian crisis, or acute adrenal insufficiency. Without treatment, an Addisonian crisis can cause death. Symptoms of an Addisonian crisis include:  Sudden, severe pain in the lower back, abdomen, or legs.  Severe vomiting and diarrhea.  Dehydration.  Low blood pressure.  Loss of consciousness. DIAGNOSIS  In its early stages, adrenal insufficiency can be hard to diagnose. Your caregiver will need to:  Review your medical history.  Review your symptoms, such as dark tanning of the  skin.  Perform lab  tests. Results will show if levels of cortisol are too low, and will help identify the cause. CT scan or MRI scan of the adrenal and pituitary glands may also be necessary. TREATMENT  With proper treatment, you can live a normal life with Addison's disease or adrenal insufficiency.  Missing hormones need to be replaced in order to treat Addison's disease. Cortisol is replaced with hydrocortisone tablets taken by mouth. Other forms of cortisol may also be used. Since cortisone levels normally are higher in the morning and lower in the evening, you may need different doses at different times of the day. Be sure to follow your instructions carefully.  If your body cannot maintain the right levels of salt (sodium) and fluids because of too little aldosterone, you will also be given a medication. This drug replaces aldosterone. Important points about treatment:  Any sudden (acute) illness can increase your body's need for cortisol. Surgery, or other stress on the body, can do the same. If you have Addison's disease and you are ill or having surgery, you will need an increase in hormone medication to prevent an Addisonian crisis. Untreated, an Addisonian crisis can cause death.  If you are too ill to take your medication or you cannot keep it down, you must take medicine through a shot (injection). You or someone who lives with you will need to learn how to give you this injection. The shot will take the place of hydrocortisone. If you find it necessary to give yourself injectable medication, call your caregiver right away, or go to the nearest hospital emergency room. HOME CARE INSTRUCTIONS   Always carry an identification card stating your condition in case of an emergency. The card should:  Alert emergency personnel about the need to inject 100 mg of cortisol if you are severely hurt or cannot respond.  Include your caregiver's name and phone number.  Include the name and number of your closest  relative to contact.  When traveling, carry a needle, syringe, and an injectable form of cortisol for emergencies.  Know how to increase medication during periods of stress or mild colds.  Wear a warning bracelet or neck chain to alert emergency personnel. Many companies sell medical ID products.  Take your medications as prescribed. Do not stop your medications without medical supervision.  Learn about your condition. Ask your caregiver for further resources. This is a potentially dangerous, but easily managed illness. SEEK MEDICAL CARE IF:   You have Addison's disease and you are in need of surgery.  You have Addison's disease and you have an acute illness.  You have weakness, weight loss, or other unexplained symptoms. SEEK IMMEDIATE MEDICAL CARE IF:   You have symptoms of crisis:  Sudden, severe pain in the lower back, abdomen, or legs.  Severe vomiting and diarrhea.  Dehydration.  Low blood pressure.  Loss of consciousness.  You are experiencing severe:  Infections or other illness.  Vomiting.  Diarrhea. Document Released: 04/28/2005 Document Revised: 04/14/2012 Document Reviewed: 09/19/2008 [facility] Patient Information 2014 [place]. Diarrhea Diarrhea is frequent loose and watery bowel movements. It can cause you to feel weak and dehydrated. Dehydration can cause you to become tired and thirsty, have a dry mouth, and have decreased urination that often is dark yellow. Diarrhea is a sign of another problem, most often an infection that will not last long. In most cases, diarrhea typically lasts 2 3 days. However, it can last longer if it is a sign  of something more serious. It is important to treat your diarrhea as directed by your caregive to lessen or prevent future episodes of diarrhea. CAUSES  Some common causes include:  Gastrointestinal infections caused by viruses, bacteria, or parasites.  Food poisoning or food allergies.  Certain medicines, such  as antibiotics, chemotherapy, and laxatives.  Artificial sweeteners and fructose.  Digestive disorders. HOME CARE INSTRUCTIONS  Ensure adequate fluid intake (hydration): have 1 cup (8 oz) of fluid for each diarrhea episode. Avoid fluids that contain simple sugars or sports drinks, fruit juices, whole milk products, and sodas. Your urine should be clear or pale yellow if you are drinking enough fluids. Hydrate with an oral rehydration solution that you can purchase at pharmacies, retail stores, and online. You can prepare an oral rehydration solution at home by mixing the following ingredients together:    tsp table salt.   tsp baking soda.   tsp salt substitute containing potassium chloride.  1  tablespoons sugar.  1 L (34 oz) of water.  Certain foods and beverages may increase the speed at which food moves through the gastrointestinal (GI) tract. These foods and beverages should be avoided and include:  Caffeinated and alcoholic beverages.  High-fiber foods, such as raw fruits and vegetables, nuts, seeds, and whole grain breads and cereals.  Foods and beverages sweetened with sugar alcohols, such as xylitol, sorbitol, and mannitol.  Some foods may be well tolerated and may help thicken stool including:  Starchy foods, such as rice, toast, pasta, low-sugar cereal, oatmeal, grits, baked potatoes, crackers, and bagels.  Bananas.  Applesauce.  Add probiotic-rich foods to help increase healthy bacteria in the GI tract, such as yogurt and fermented milk products.  Wash your hands well after each diarrhea episode.  Only take over-the-counter or prescription medicines as directed by your caregiver.  Take a warm bath to relieve any burning or pain from frequent diarrhea episodes. SEEK IMMEDIATE MEDICAL CARE IF:   You are unable to keep fluids down.  You have persistent vomiting.  You have blood in your stool, or your stools are black and tarry.  You do not urinate in 6 8  hours, or there is only a small amount of very dark urine.  You have abdominal pain that increases or localizes.  You have weakness, dizziness, confusion, or lightheadedness.  You have a severe headache.  Your diarrhea gets worse or does not get better.  You have a fever or persistent symptoms for more than 2 3 days.  You have a fever and your symptoms suddenly get worse. MAKE SURE YOU:   Understand these instructions.  Will watch your condition.  Will get help right away if you are not doing well or get worse. Document Released: 04/18/2002 Document Revised: 04/14/2012 Document Reviewed: 01/04/2012 [facility] Patient Information 2014 [place], [place].

## 2013-10-08 NOTE — ED Notes (Signed)
PT reports diarrhea x several days.  Worked in the yard this morning, got hot and had a syncopal episode.  A/O since that time.

## 2013-10-09 NOTE — ED Provider Notes (Signed)
Medical screening examination/treatment/procedure(s) were performed by non-physician practitioner and as supervising physician I was immediately available for consultation/collaboration.   EKG Interpretation   Date/Time:  Saturday Oct 08 2013 11:55:48 EDT Ventricular Rate:  86 PR Interval:  167 QRS Duration: 96 QT Interval:  350 QTC Calculation: 419 R Axis:   79 Text Interpretation:  Sinus rhythm ST elev, probable normal early repol  pattern Baseline wander in lead(s) V3 No significant change since last  tracing Confirmed by Anitra Lauth  MD, Alphonzo Lemmings ([id number]) on 10/08/2013 12:04:27  PM      Pt with hx of addison's disease on florinef with recent v/d who presents with syncope.  Pt's labs wnl.  Feeling better after fluids and given a double dose of steroids.  BP has remained stable.  Gwyneth Sprout, MD 10/09/13 [phone]

## 2013-11-30 DIAGNOSIS — R4020 Unspecified coma: Secondary | ICD-10-CM | POA: Insufficient documentation

## 2013-11-30 DIAGNOSIS — F319 Bipolar disorder, unspecified: Secondary | ICD-10-CM | POA: Insufficient documentation

## 2013-11-30 DIAGNOSIS — E271 Primary adrenocortical insufficiency: Secondary | ICD-10-CM | POA: Insufficient documentation

## 2014-07-13 ENCOUNTER — Other Ambulatory Visit: Payer: Self-pay | Admitting: Orthopedic Surgery

## 2014-07-13 DIAGNOSIS — M542 Cervicalgia: Secondary | ICD-10-CM

## 2014-07-21 ENCOUNTER — Inpatient Hospital Stay: Admission: RE | Admit: 2014-07-21 | Payer: Federal, State, Local not specified - PPO | Source: Ambulatory Visit

## 2014-08-02 ENCOUNTER — Ambulatory Visit
Admission: RE | Admit: 2014-08-02 | Discharge: 2014-08-02 | Disposition: A | Discharge status: Home / Self Care | Payer: Federal, State, Local not specified - PPO | Source: Ambulatory Visit | Attending: Orthopedic Surgery | Admitting: Orthopedic Surgery

## 2014-08-02 ENCOUNTER — Encounter: Payer: Self-pay | Admitting: Radiology

## 2014-08-02 DIAGNOSIS — M542 Cervicalgia: Secondary | ICD-10-CM

## 2014-08-02 MED ORDER — DIAZEPAM 5 MG PO TABS
5.0000 mg | ORAL_TABLET | Freq: Once | ORAL | Status: AC
  Administered 2014-08-02: 5 mg via ORAL

## 2014-08-02 MED ORDER — IOHEXOL 300 MG/ML  SOLN
10.0000 mL | Freq: Once | INTRAMUSCULAR | Status: AC | PRN
  Administered 2014-08-02: 10 mL via INTRATHECAL

## 2014-08-02 NOTE — Progress Notes (Signed)
States he has not taken Abilify and Latuda for several days. Discharge instructions explained to pt.

## 2014-08-02 NOTE — Discharge Instructions (Signed)
Myelogram Discharge Instructions  1. Go home and rest quietly for the next 24 hours.  It is important to lie flat for the next 24 hours.  Get up only to go to the restroom.  You may lie in the bed or on a couch on your back, your stomach, your left side or your right side.  You may have one pillow under your head.  You may have pillows between your knees while you are on your side or under your knees while you are on your back.  2. DO NOT drive today.  Recline the seat as far back as it will go, while still wearing your seat belt, on the way home.  3. You may get up to go to the bathroom as needed.  You may sit up for 10 minutes to eat.  You may resume your normal diet and medications unless otherwise indicated.  Drink lots of extra fluids today and tomorrow.  4. The incidence of headache, nausea, or vomiting is about 5% (one in 20 patients).  If you develop a headache, lie flat and drink plenty of fluids until the headache goes away.  Caffeinated beverages may be helpful.  If you develop severe nausea and vomiting or a headache that does not go away with flat bed rest, call [phone].  5. You may resume normal activities after your 24 hours of bed rest is over; however, do not exert yourself strongly or do any heavy lifting tomorrow. If when you get up you have a headache when standing, go back to bed and force fluids for another 24 hours.  6. Call your physician for a follow-up appointment.  The results of your myelogram will be sent directly to your physician by the following day.  7. If you have any questions or if complications develop after you arrive home, please call [phone].  Discharge instructions have been explained to the patient.  The patient, or the person responsible for the patient, fully understands these instructions.       May resume Abilify and Latuda on August 03, 2014 after 7:30 am.

## 2014-09-05 ENCOUNTER — Encounter: Payer: Self-pay | Admitting: Neurology

## 2014-09-05 ENCOUNTER — Ambulatory Visit (INDEPENDENT_AMBULATORY_CARE_PROVIDER_SITE_OTHER): Payer: Self-pay | Admitting: Neurology

## 2014-09-05 ENCOUNTER — Ambulatory Visit (INDEPENDENT_AMBULATORY_CARE_PROVIDER_SITE_OTHER): Payer: Federal, State, Local not specified - PPO | Admitting: Neurology

## 2014-09-05 DIAGNOSIS — M542 Cervicalgia: Secondary | ICD-10-CM

## 2014-09-05 DIAGNOSIS — M79602 Pain in left arm: Secondary | ICD-10-CM

## 2014-09-05 NOTE — Progress Notes (Signed)
Please refer to EMG and nerve conduction study procedure note. 

## 2014-09-05 NOTE — Procedures (Signed)
     HISTORY:  Jesus Rice is a 24 year old gentleman with a history of left neck, arm, and hand discomfort that began in December 2015. The patient has had ongoing symptoms. He has a sensation of some numbness and weakness in the arm as well, and increased pain in the left arm with turning the head to the left. He is being evaluated for the ongoing discomfort.  NERVE CONDUCTION STUDIES:  Nerve conduction studies were performed on the left upper extremity. The distal motor latencies and motor amplitudes for the median and ulnar nerves were within normal limits. The F wave latencies and nerve conduction velocities for these nerves were also normal. The sensory latencies for the median and ulnar nerves were normal.   EMG STUDIES:  EMG study was performed on the left upper extremity:  The first dorsal interosseous muscle reveals 2 to 4 K units with full recruitment. No fibrillations or positive waves were noted. The abductor pollicis brevis muscle reveals 2 to 4 K units with full recruitment. No fibrillations or positive waves were noted. The extensor indicis proprius muscle reveals 1 to 3 K units with full recruitment. No fibrillations or positive waves were noted. The pronator teres muscle reveals 2 to 3 K units with full recruitment. No fibrillations or positive waves were noted. The biceps muscle reveals 1 to 2 K units with full recruitment. No fibrillations or positive waves were noted. The triceps muscle reveals 2 to 4 K units with full recruitment. No fibrillations or positive waves were noted. The anterior deltoid muscle reveals 2 to 3 K units with full recruitment. No fibrillations or positive waves were noted. The cervical paraspinal muscles were tested at 2 levels. No abnormalities of insertional activity were seen at either level tested. There was fair relaxation.   IMPRESSION:  Nerve conduction studies done on the left upper extremity were unremarkable. No evidence of a  neuropathy is seen. EMG evaluation of the left upper extremity is unremarkable, without evidence of an overlying cervical radiculopathy.  Jesus Rice. Keith Willis MD 09/05/2014 4:16 PM  [facility] [address] [place], [place] [id number]  Phone [phone] Fax [phone]

## 2014-09-07 ENCOUNTER — Ambulatory Visit (INDEPENDENT_AMBULATORY_CARE_PROVIDER_SITE_OTHER): Payer: Federal, State, Local not specified - PPO | Admitting: Neurology

## 2014-09-07 ENCOUNTER — Encounter: Payer: Self-pay | Admitting: Neurology

## 2014-09-07 VITALS — BP 124/74 | HR 67 | Ht 73.0 in | Wt 242.0 lb

## 2014-09-07 DIAGNOSIS — M79602 Pain in left arm: Secondary | ICD-10-CM

## 2014-09-07 DIAGNOSIS — G8929 Other chronic pain: Secondary | ICD-10-CM | POA: Diagnosis not present

## 2014-09-07 DIAGNOSIS — M545 Low back pain, unspecified: Secondary | ICD-10-CM

## 2014-09-07 DIAGNOSIS — R55 Syncope and collapse: Secondary | ICD-10-CM | POA: Diagnosis not present

## 2014-09-07 HISTORY — DX: Syncope and collapse: R55

## 2014-09-07 HISTORY — DX: Other chronic pain: G89.29

## 2014-09-07 HISTORY — DX: Low back pain, unspecified: M54.50

## 2014-09-07 MED ORDER — GABAPENTIN 300 MG PO CAPS
ORAL_CAPSULE | ORAL | Status: AC
Start: 2014-09-07 — End: ?

## 2014-09-07 NOTE — Progress Notes (Signed)
Reason for visit: Left shoulder and arm discomfort  Referring physician: Dr. Meriel FlavorsBrooks  Langley Jesus Rice is a 24 y.o. male  History of present illness:  Mr. Jesus Rice is a 24 year old right-handed white male with a history of spontaneous onset of left neck and shoulder discomfort that began in December 2015. The patient indicated that he felt a pop in the shoulder and the pain ensued. The patient initially was sent for physical therapy, but at that time he could not tolerate therapy. The patient was eventually set up for a cervical myelogram with CT to follow. This showed no abnormalities of the cervical spine. He was sent to this office for EMG and nerve conduction study, and this was unremarkable. The patient has had ongoing pain, the pain is worse when he turns his head to the left, with pain radiating down the left arm. Most of the pain currently is in the forearm area, not in the neck and shoulder. The patient believes that the left arm is somewhat weak, and he feels that there is some numbness of the left arm. He denies problems on the right side. He is not having problems with balance or difficulty controlling the bowels or the bladder. He is sent to this office for an evaluation. MRI of the cervical spine was not done as the patient has a loop recorder placed after he has had several episodes of syncope that has been unexplained.  Past Medical History  Diagnosis Date  . Asthma   . Obesity   . Bipolar disorder   . Addison's disease   . Depression   . Hx of ulcer disease   . Chronic low back pain 09/07/2014  . Syncope and collapse 09/07/2014    Past Surgical History  Procedure Laterality Date  . Tonsillectomy    . Adenoidectomy    . Knee surgery    . Tooth extraction      Family History  Problem Relation Age of Onset  . Fibromyalgia Mother   . Breast cancer Maternal Grandmother     Social history:  reports that he has never smoked. He has never used smokeless tobacco. He  reports that he does not drink alcohol or use illicit drugs.  Medications:  Prior to Admission medications   Medication Sig Start Date End Date Taking? Authorizing Provider  ARIPiprazole (ABILIFY) 10 MG tablet Take 10 mg by mouth daily.   Yes Historical Provider, MD  fludrocortisone (FLORINEF) 0.1 MG tablet Take 0.1 mg by mouth daily.   Yes Historical Provider, MD  Lurasidone HCl 60 MG TABS Take 1 tablet by mouth daily.   Yes Historical Provider, MD      Allergies  Allergen Reactions  . Geodon [Ziprasidone Mesylate] Other (See Comments)    LOC   . Sulfa Antibiotics Hives and Rash  . Bee Venom     ROS:  Out of a complete 14 system review of symptoms, the patient complains only of the following symptoms, and all other reviewed systems are negative.  Weight loss Shortness of breath, wheezing Increased thirst Cramps Allergies, runny nose Headache, numbness, weakness, dizziness Depression  Blood pressure 124/74, pulse 67, height 6\' 1"  (1.854 m), weight 242 lb (109.77 kg).  Physical Exam  General: The patient is alert and cooperative at the time of the examination.  Eyes: Pupils are equal, round, and reactive to light. Discs are flat bilaterally.  Neck: The neck is supple, no carotid bruits are noted.  Respiratory: The respiratory examination is clear.  Cardiovascular: The cardiovascular examination reveals a regular rate and rhythm, no obvious murmurs or rubs are noted.  Neuromuscular: Range of movement of the cervical spine was relatively full. Radial artery pulses were full and normal with arm elevation, and with head turning from one side to the next bilaterally.  Skin: Extremities are without significant edema.  Neurologic Exam  Mental status: The patient is alert and oriented x 3 at the time of the examination. The patient has apparent normal recent and remote memory, with an apparently normal attention span and concentration ability.  Cranial nerves: Facial  symmetry is present. There is good sensation of the face to pinprick and soft touch bilaterally. The strength of the facial muscles and the muscles to head turning and shoulder shrug are normal bilaterally. Speech is well enunciated, no aphasia or dysarthria is noted. Extraocular movements are full. Visual fields are full. The tongue is midline, and the patient has symmetric elevation of the soft palate. No obvious hearing deficits are noted.  Motor: The motor testing reveals 5 over 5 strength of all 4 extremities. Good symmetric motor tone is noted throughout.  Sensory: Sensory testing is intact to pinprick, soft touch, vibration sensation, and position sense on all 4 extremities. No evidence of extinction is noted.  Coordination: Cerebellar testing reveals good finger-nose-finger and heel-to-shin bilaterally.  Gait and station: Gait is normal. Tandem gait is normal. Romberg is negative. No drift is seen.  Reflexes: Deep tendon reflexes are symmetric and normal bilaterally. Toes are downgoing bilaterally.   Cervical CT/ myelogram 08/02/14:  IMPRESSION: Negative cervical myelogram and postmyelogram CT.  * CT scan images were reviewed online. I agree with the written report.   Assessment/Plan:  1. Left shoulder, arm discomfort  Etiology of the arm discomfort is not clear. The patient has had normal cervical spine evaluations, and normal EMG nerve conduction studies. There is no evidence of a radiculopathy or a neuropathy that would explain his current pain syndrome. The patient appears to have neuromuscular pain. The patient will be sent for physical therapy again for neuromuscular therapy. Gabapentin will be added in low dose. He will follow-up in 3 months.  [name] MD 09/07/2014 7:24 PM  [facility] [address] [place], [place] [address]  Phone [phone] Fax [phone]

## 2014-09-07 NOTE — Patient Instructions (Signed)

## 2014-12-26 ENCOUNTER — Ambulatory Visit: Payer: Federal, State, Local not specified - PPO | Admitting: Neurology

## 2015-04-18 ENCOUNTER — Emergency Department (HOSPITAL_COMMUNITY): Payer: Federal, State, Local not specified - PPO

## 2015-04-18 ENCOUNTER — Encounter (HOSPITAL_COMMUNITY): Payer: Self-pay

## 2015-04-18 ENCOUNTER — Emergency Department (HOSPITAL_COMMUNITY)
Admission: EM | Admit: 2015-04-18 | Discharge: 2015-04-18 | Disposition: A | Discharge status: Home / Self Care | Payer: Federal, State, Local not specified - PPO | Attending: Emergency Medicine | Admitting: Emergency Medicine

## 2015-04-18 DIAGNOSIS — Z79899 Other long term (current) drug therapy: Secondary | ICD-10-CM | POA: Insufficient documentation

## 2015-04-18 DIAGNOSIS — S40012A Contusion of left shoulder, initial encounter: Secondary | ICD-10-CM | POA: Diagnosis not present

## 2015-04-18 DIAGNOSIS — Z7952 Long term (current) use of systemic steroids: Secondary | ICD-10-CM | POA: Diagnosis not present

## 2015-04-18 DIAGNOSIS — F319 Bipolar disorder, unspecified: Secondary | ICD-10-CM | POA: Insufficient documentation

## 2015-04-18 DIAGNOSIS — Y998 Other external cause status: Secondary | ICD-10-CM | POA: Diagnosis not present

## 2015-04-18 DIAGNOSIS — Y92410 Unspecified street and highway as the place of occurrence of the external cause: Secondary | ICD-10-CM | POA: Insufficient documentation

## 2015-04-18 DIAGNOSIS — S39012A Strain of muscle, fascia and tendon of lower back, initial encounter: Secondary | ICD-10-CM

## 2015-04-18 DIAGNOSIS — S29002A Unspecified injury of muscle and tendon of back wall of thorax, initial encounter: Secondary | ICD-10-CM | POA: Diagnosis present

## 2015-04-18 DIAGNOSIS — Y9389 Activity, other specified: Secondary | ICD-10-CM | POA: Insufficient documentation

## 2015-04-18 DIAGNOSIS — S29012A Strain of muscle and tendon of back wall of thorax, initial encounter: Secondary | ICD-10-CM | POA: Diagnosis not present

## 2015-04-18 DIAGNOSIS — Z8639 Personal history of other endocrine, nutritional and metabolic disease: Secondary | ICD-10-CM | POA: Diagnosis not present

## 2015-04-18 HISTORY — DX: Bipolar disorder, unspecified: F31.9

## 2015-04-18 LAB — CBC
HEMATOCRIT: 39.9 % (ref 39.0–52.0)
Hemoglobin: 14.4 g/dL (ref 13.0–17.0)
MCH: 29.6 pg (ref 26.0–34.0)
MCHC: 36.1 g/dL — AB (ref 30.0–36.0)
MCV: 81.9 fL (ref 78.0–100.0)
PLATELETS: 176 10*3/uL (ref 150–400)
RBC: 4.87 MIL/uL (ref 4.22–5.81)
RDW: 12.1 % (ref 11.5–15.5)
WBC: 6.7 10*3/uL (ref 4.0–10.5)

## 2015-04-18 LAB — COMPREHENSIVE METABOLIC PANEL
ALBUMIN: 4.3 g/dL (ref 3.5–5.0)
ALK PHOS: 98 U/L (ref 38–126)
ALT: 19 U/L (ref 17–63)
AST: 25 U/L (ref 15–41)
Anion gap: 8 (ref 5–15)
BILIRUBIN TOTAL: 0.9 mg/dL (ref 0.3–1.2)
BUN: 15 mg/dL (ref 6–20)
CO2: 26 mmol/L (ref 22–32)
CREATININE: 0.79 mg/dL (ref 0.61–1.24)
Calcium: 9.7 mg/dL (ref 8.9–10.3)
Chloride: 106 mmol/L (ref 101–111)
GFR calc Af Amer: >60 mL/min (ref >60)
GFR calc non Af Amer: >60 mL/min (ref >60)
GLUCOSE: 113 mg/dL — AB (ref 65–99)
POTASSIUM: 3.7 mmol/L (ref 3.5–5.1)
Sodium: 140 mmol/L (ref 135–145)
TOTAL PROTEIN: 6.7 g/dL (ref 6.5–8.1)

## 2015-04-18 LAB — SAMPLE TO BLOOD BANK

## 2015-04-18 LAB — ETHANOL: Alcohol, Ethyl (B): <5 mg/dL (ref <5)

## 2015-04-18 LAB — PROTIME-INR
INR: 1.18 (ref 0.00–1.49)
PROTHROMBIN TIME: 15.2 s (ref 11.6–15.2)

## 2015-04-18 LAB — CDS SEROLOGY

## 2015-04-18 MED ORDER — HYDROCODONE-ACETAMINOPHEN 5-325 MG PO TABS
2.0000 | ORAL_TABLET | Freq: Once | ORAL | Status: AC
  Administered 2015-04-18: 2 via ORAL
  Filled 2015-04-18: qty 2

## 2015-04-18 MED ORDER — MORPHINE SULFATE (PF) 4 MG/ML IV SOLN
4.0000 mg | Freq: Once | INTRAVENOUS | Status: AC
  Administered 2015-04-18: 4 mg via INTRAVENOUS
  Filled 2015-04-18: qty 1

## 2015-04-18 MED ORDER — HYDROCODONE-ACETAMINOPHEN 5-325 MG PO TABS: 1.0000 | ORAL_TABLET | ORAL | Status: AC | PRN

## 2015-04-18 MED ORDER — IOHEXOL 300 MG/ML  SOLN
80.0000 mL | Freq: Once | INTRAMUSCULAR | Status: AC | PRN
  Administered 2015-04-18: 80 mL via INTRAVENOUS

## 2015-04-18 MED ORDER — IBUPROFEN 800 MG PO TABS
800.0000 mg | ORAL_TABLET | Freq: Once | ORAL | Status: AC
  Administered 2015-04-18: 800 mg via ORAL
  Filled 2015-04-18: qty 1

## 2015-04-18 MED ORDER — ONDANSETRON HCL 4 MG/2ML IJ SOLN
4.0000 mg | Freq: Once | INTRAMUSCULAR | Status: AC
  Administered 2015-04-18: 4 mg via INTRAVENOUS
  Filled 2015-04-18: qty 2

## 2015-04-18 MED ORDER — IBUPROFEN 800 MG PO TABS: 800.0000 mg | ORAL_TABLET | Freq: Three times a day (TID) | ORAL | Status: AC

## 2015-04-18 NOTE — ED Notes (Signed)
Pt. Called out and is nauseated, Relayed to Dr. Donnald GarrePfeiffer
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
## 2015-04-18 NOTE — ED Notes (Signed)
Pt. Is eating crackers and gingerale.  Pt. Given burgundy scrubs .

## 2015-04-18 NOTE — ED Notes (Signed)
Patient transported to CT 
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
## 2015-04-18 NOTE — ED Notes (Signed)
Pt. Ate crackers and gingerale and became nauseated approximately 20 minutes after eating.  He vomited up the crackers.  Pt. Stated, "I feel better, my headache is gone and I don't have nausea."

## 2015-04-18 NOTE — ED Notes (Signed)
Dr. Pfeiffer MD at bedside. 
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
## 2015-04-18 NOTE — ED Notes (Signed)
Pt. Given blue scrubs to go home with

## 2015-04-18 NOTE — ED Notes (Signed)
Gave Gingerale and crackers, Updated pt. And family on plan of care
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
## 2015-04-19 ENCOUNTER — Encounter: Payer: Self-pay | Admitting: Neurology

## 2017-06-04 ENCOUNTER — Emergency Department (HOSPITAL_BASED_OUTPATIENT_CLINIC_OR_DEPARTMENT_OTHER)
Admission: EM | Admit: 2017-06-04 | Discharge: 2017-06-05 | Disposition: A | Discharge status: Home / Self Care | Payer: Federal, State, Local not specified - PPO | Attending: Emergency Medicine | Admitting: Emergency Medicine

## 2017-06-04 ENCOUNTER — Other Ambulatory Visit: Payer: Self-pay

## 2017-06-04 ENCOUNTER — Encounter (HOSPITAL_BASED_OUTPATIENT_CLINIC_OR_DEPARTMENT_OTHER): Payer: Self-pay | Admitting: *Deleted

## 2017-06-04 DIAGNOSIS — F17210 Nicotine dependence, cigarettes, uncomplicated: Secondary | ICD-10-CM | POA: Insufficient documentation

## 2017-06-04 DIAGNOSIS — J45909 Unspecified asthma, uncomplicated: Secondary | ICD-10-CM | POA: Insufficient documentation

## 2017-06-04 DIAGNOSIS — J029 Acute pharyngitis, unspecified: Secondary | ICD-10-CM | POA: Diagnosis present

## 2017-06-04 DIAGNOSIS — R69 Illness, unspecified: Secondary | ICD-10-CM

## 2017-06-04 DIAGNOSIS — J111 Influenza due to unidentified influenza virus with other respiratory manifestations: Secondary | ICD-10-CM | POA: Diagnosis not present

## 2017-06-04 DIAGNOSIS — Z79899 Other long term (current) drug therapy: Secondary | ICD-10-CM | POA: Diagnosis not present

## 2017-06-04 LAB — RAPID STREP SCREEN (MED CTR MEBANE ONLY): STREPTOCOCCUS, GROUP A SCREEN (DIRECT): NEGATIVE

## 2017-06-04 NOTE — ED Triage Notes (Signed)
Sore throat since last night. States the glands in his neck feel swollen.

## 2017-06-05 MED ORDER — IBUPROFEN 400 MG PO TABS
600.0000 mg | ORAL_TABLET | Freq: Once | ORAL | Status: AC
  Administered 2017-06-05: 600 mg via ORAL
  Filled 2017-06-05: qty 1

## 2017-06-05 MED ORDER — OSELTAMIVIR PHOSPHATE 75 MG PO CAPS: 75.0000 mg | ORAL_CAPSULE | Freq: Two times a day (BID) | ORAL | 0 refills | Status: AC

## 2017-06-05 NOTE — ED Provider Notes (Signed)
[facility] EMERGENCY DEPARTMENT Provider Note   CSN: [id number] Arrival date & time: 06/04/17  2221     History   Chief Complaint Chief Complaint  Patient presents with  . Sore Throat    HPI Jesus Rice is a 27 y.o. male who presents with a sore throat.  Past medical history significant for bipolar disorder.  Past surgical history significant for adenoidectomy and tonsillectomy.  States that he woke up this morning with acute onset of subjective fever, chills, sweats, body aches.  He also noted that his throat was painful and swollen and he felt like his anterior lymph nodes were swollen.  Pain from the lymph nodes radiates up to his bilateral ears.  He reports a mild runny nose and cough.  He took an Advil PM without significant relief.  He has not been able to rest today because of the alternating sweats and chills.  No headache, ear pain, shortness of breath, chest pain, abdominal pain, vomiting or diarrhea.  He denies any known sick contacts.  He has not had a flu shot this year.  HPI  Past Medical History:  Diagnosis Date  . Addison's disease (HCC)   . Asthma   . Bipolar 1 disorder (HCC)   . Bipolar disorder (HCC)   . Chronic low back pain 09/07/2014  . Depression   . Hx of ulcer disease   . Obesity   . Syncope and collapse 09/07/2014    Patient Active Problem List   Diagnosis Date Noted  . Chronic low back pain 09/07/2014  . Syncope and collapse 09/07/2014  . Left arm pain 09/05/2014  . Addison disease (HCC) 11/30/2013  . Affective bipolar disorder (HCC) 11/30/2013  . 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 Neuralgia neuritis, sciatic nerve 04/04/2013  . 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 Airway hyperreactivity 10/16/2012  . Bipolar I disorder, single manic episode (HCC) 10/16/2012    Past Surgical History:  Procedure Laterality Date  . ADENOIDECTOMY    . KNEE ARTHROSCOPY Right   . KNEE SURGERY    . TONSILLECTOMY    . TOOTH  EXTRACTION         Home Medications    Prior to Admission medications   Medication Sig Start Date End Date Taking? Authorizing Provider  ARIPiprazole (ABILIFY) 10 MG tablet Take 10 mg by mouth daily.    [provider]  ARIPiprazole (ABILIFY) 10 MG tablet Take 10 mg by mouth at bedtime.     [provider]  fludrocortisone (FLORINEF) 0.1 MG tablet Take 0.1 mg by mouth daily.    [provider]  fludrocortisone (FLORINEF) 0.1 MG tablet Take 0.1 mg by mouth 2 (two) times daily.    [provider]  gabapentin (NEURONTIN) 300 MG capsule One capsule twice a day for 2 weeks, then take one in the morning and 2 in the evening 09/07/14   York SpanielWillis, Charles K, MD  gabapentin (NEURONTIN) 300 MG capsule Take 300 mg by mouth 3 (three) times daily.    [provider]  HYDROcodone-acetaminophen (NORCO/VICODIN) 5-325 MG tablet Take 1-2 tablets by mouth every 4 (four) hours as needed for moderate pain or severe pain. 04/18/15   Arby BarrettePfeiffer, Marcy, MD  hydrocortisone (CORTEF) 10 MG tablet Take 10-20 mg by mouth 2 (two) times daily. Pt takes 10 mg in the am and 20 mg in the pm    [provider]  ibuprofen (ADVIL,MOTRIN) 800 MG tablet Take 1 tablet (800 mg total)  by mouth 3 (three) times daily. 04/18/15   Arby Barrette, MD  Lurasidone HCl (LATUDA) 60 MG TABS Take 60 mg by mouth every evening.    [provider]  Lurasidone HCl 60 MG TABS Take 1 tablet by mouth daily.    [provider]  oseltamivir (TAMIFLU) 75 MG capsule Take 1 capsule (75 mg total) by mouth every 12 (twelve) hours. 06/05/17   Bethel Born, PA-C    Family History Family History  Problem Relation Age of Onset  . Fibromyalgia Mother   . Breast cancer Maternal Grandmother     Social History Social History   Tobacco Use  . Smoking status: Current Every Day Smoker  . Smokeless tobacco: Never Used  Substance Use Topics  . Alcohol use: No  . Drug use: No      Allergies   Geodon [ziprasidone mesylate]; Sulfa antibiotics; Bee venom; Geodon [ziprasidone hcl]; and Sulfa antibiotics   Review of Systems Review of Systems  Constitutional: Positive for chills, diaphoresis and fever.  HENT: Positive for rhinorrhea and sore throat. Negative for congestion and ear pain.   Respiratory: Positive for cough. Negative for shortness of breath.   Cardiovascular: Negative for chest pain.  Gastrointestinal: Negative for abdominal pain, nausea and vomiting.     Physical Exam Updated Vital Signs BP 130/90 (BP Location: Left Arm)   Pulse 77   Temp 98.3 F (36.8 C) (Oral)   Resp 18   Ht 5\' 9"  (1.753 m)   Wt 90.7 kg (200 lb)   SpO2 98%   BMI 29.53 kg/m   Physical Exam  Constitutional: He is oriented to person, place, and time. He appears well-developed and well-nourished. No distress.  HENT:  Head: Normocephalic and atraumatic.  Right Ear: Hearing, tympanic membrane, external ear and ear canal normal.  Left Ear: Hearing, tympanic membrane, external ear and ear canal normal.  Nose: Nose normal.  Mouth/Throat: Uvula is midline, oropharynx is clear and moist and mucous membranes are normal.  Eyes: Conjunctivae are normal. Pupils are equal, round, and reactive to light. Right eye exhibits no discharge. Left eye exhibits no discharge. No scleral icterus.  Neck: Normal range of motion.  Cardiovascular: Normal rate and regular rhythm. Exam reveals no gallop and no friction rub.  No murmur heard. Pulmonary/Chest: Effort normal and breath sounds normal. No stridor. No respiratory distress. He has no wheezes. He has no rales. He exhibits no tenderness.  Abdominal: He exhibits no distension.  Neurological: He is alert and oriented to person, place, and time.  Skin: Skin is warm and dry.  Psychiatric: He has a normal mood and affect. His behavior is normal.  Nursing note and vitals reviewed.    ED Treatments / Results  Labs (all labs ordered are  listed, but only abnormal results are displayed) Labs Reviewed  RAPID STREP SCREEN (NOT AT [facility])  CULTURE, GROUP A STREP [facility])    EKG  EKG Interpretation None       Radiology No results found.  Procedures Procedures (including critical care time)  Medications Ordered in ED Medications  ibuprofen (ADVIL,MOTRIN) tablet 600 mg (600 mg Oral Given 06/05/17 0031)     Initial Impression / Assessment and Plan / ED Course  I have reviewed the triage vital signs and the nursing notes.  Pertinent labs & imaging results that were available during my care of the patient were reviewed by me and considered in my medical decision making (see chart for details).  27 year old male  presents with influenza like illness. Vitals are normal. Exam is overall unremarkable. Since symptoms are <24 onset discussed rx for Tamiflu. Discussed risk vs benefit - he would like a prescription. He was advised to alterate Tylenol and Ibuprofen for throat pain and return if worsening.  Final Clinical Impressions(s) / ED Diagnoses   Final diagnoses:  Influenza-like illness    ED Discharge Orders        Ordered    oseltamivir (TAMIFLU) 75 MG capsule  Every 12 hours     06/05/17 0022       Bethel Born, PA-C 06/05/17 7829    Azalia Bilis, MD 06/05/17 [phone]

## 2017-06-05 NOTE — Discharge Instructions (Signed)
Take Ibuprofen 600mg  three times daily. Alternate with Tylenol Take Tamiflu as directed Return if worsening

## 2017-06-07 LAB — CULTURE, GROUP A STREP (THRC)
